# Patient Record
Sex: Female | Born: 1955 | Race: Black or African American | Hispanic: No | Marital: Single | Smoking: Never smoker
Health system: Southern US, Community
[De-identification: ages and names within clinical notes are randomized; demographics above are authoritative.]

## PROBLEM LIST (undated history)

## (undated) DIAGNOSIS — I10 Essential (primary) hypertension: Secondary | ICD-10-CM

## (undated) HISTORY — DX: Essential (primary) hypertension: I10

---

## 2002-10-07 ENCOUNTER — Emergency Department (HOSPITAL_COMMUNITY): Admission: EM | Admit: 2002-10-07 | Discharge: 2002-10-07 | Payer: Self-pay | Admitting: Emergency Medicine

## 2003-09-18 ENCOUNTER — Other Ambulatory Visit: Admission: RE | Admit: 2003-09-18 | Discharge: 2003-09-18 | Payer: Self-pay | Admitting: Family Medicine

## 2003-09-25 ENCOUNTER — Ambulatory Visit (HOSPITAL_COMMUNITY): Admission: RE | Admit: 2003-09-25 | Discharge: 2003-09-25 | Payer: Self-pay | Admitting: Family Medicine

## 2004-05-24 ENCOUNTER — Ambulatory Visit: Payer: Self-pay | Admitting: Nurse Practitioner

## 2012-06-06 HISTORY — PX: OTHER SURGICAL HISTORY: SHX169

## 2017-06-29 ENCOUNTER — Other Ambulatory Visit: Payer: Self-pay | Admitting: Internal Medicine

## 2017-06-29 DIAGNOSIS — Z1231 Encounter for screening mammogram for malignant neoplasm of breast: Secondary | ICD-10-CM

## 2017-06-29 DIAGNOSIS — E2839 Other primary ovarian failure: Secondary | ICD-10-CM

## 2017-07-25 ENCOUNTER — Ambulatory Visit
Admission: RE | Admit: 2017-07-25 | Discharge: 2017-07-25 | Disposition: A | Discharge status: Home / Self Care | Payer: Medicaid Other | Source: Ambulatory Visit | Attending: Internal Medicine | Admitting: Internal Medicine

## 2017-07-25 DIAGNOSIS — E2839 Other primary ovarian failure: Secondary | ICD-10-CM

## 2017-07-25 DIAGNOSIS — Z1231 Encounter for screening mammogram for malignant neoplasm of breast: Secondary | ICD-10-CM

## 2017-07-26 ENCOUNTER — Other Ambulatory Visit: Payer: Self-pay | Admitting: Internal Medicine

## 2017-07-26 DIAGNOSIS — R928 Other abnormal and inconclusive findings on diagnostic imaging of breast: Secondary | ICD-10-CM

## 2017-08-01 ENCOUNTER — Ambulatory Visit
Admission: RE | Admit: 2017-08-01 | Discharge: 2017-08-01 | Disposition: A | Discharge status: Home / Self Care | Payer: Medicaid Other | Source: Ambulatory Visit | Attending: Internal Medicine | Admitting: Internal Medicine

## 2017-08-01 ENCOUNTER — Other Ambulatory Visit: Payer: Self-pay | Admitting: Internal Medicine

## 2017-08-01 DIAGNOSIS — R928 Other abnormal and inconclusive findings on diagnostic imaging of breast: Secondary | ICD-10-CM

## 2018-01-30 ENCOUNTER — Other Ambulatory Visit: Payer: Medicaid Other

## 2018-02-21 ENCOUNTER — Other Ambulatory Visit: Payer: Self-pay | Admitting: Internal Medicine

## 2018-02-21 DIAGNOSIS — R928 Other abnormal and inconclusive findings on diagnostic imaging of breast: Secondary | ICD-10-CM

## 2018-03-02 ENCOUNTER — Other Ambulatory Visit: Payer: Self-pay | Admitting: Internal Medicine

## 2018-03-02 ENCOUNTER — Ambulatory Visit
Admission: RE | Admit: 2018-03-02 | Discharge: 2018-03-02 | Disposition: A | Discharge status: Home / Self Care | Payer: Medicaid Other | Source: Ambulatory Visit | Attending: Internal Medicine | Admitting: Internal Medicine

## 2018-03-02 DIAGNOSIS — N6310 Unspecified lump in the right breast, unspecified quadrant: Secondary | ICD-10-CM

## 2018-03-02 DIAGNOSIS — N6320 Unspecified lump in the left breast, unspecified quadrant: Secondary | ICD-10-CM

## 2018-03-02 DIAGNOSIS — R928 Other abnormal and inconclusive findings on diagnostic imaging of breast: Secondary | ICD-10-CM

## 2018-05-24 ENCOUNTER — Encounter: Payer: Self-pay | Admitting: Obstetrics and Gynecology

## 2018-05-24 ENCOUNTER — Other Ambulatory Visit (HOSPITAL_COMMUNITY)
Admission: RE | Admit: 2018-05-24 | Discharge: 2018-05-24 | Disposition: A | Discharge status: Home / Self Care | Payer: Medicaid Other | Source: Ambulatory Visit | Attending: Obstetrics and Gynecology | Admitting: Obstetrics and Gynecology

## 2018-05-24 ENCOUNTER — Ambulatory Visit: Payer: Medicaid Other | Admitting: Obstetrics and Gynecology

## 2018-05-24 VITALS — BP 130/86 | HR 87 | Ht 63.0 in | Wt 166.1 lb

## 2018-05-24 DIAGNOSIS — Z01419 Encounter for gynecological examination (general) (routine) without abnormal findings: Secondary | ICD-10-CM | POA: Insufficient documentation

## 2018-05-24 DIAGNOSIS — Z0000 Encounter for general adult medical examination without abnormal findings: Secondary | ICD-10-CM | POA: Diagnosis not present

## 2018-05-24 DIAGNOSIS — Z113 Encounter for screening for infections with a predominantly sexual mode of transmission: Secondary | ICD-10-CM

## 2018-05-24 NOTE — Progress Notes (Signed)
New patient in the office for annual, states last mammogram 04-23-18. Pt has no complaints today.

## 2018-05-24 NOTE — Patient Instructions (Signed)
Try Remifemin for menopausal symptoms.

## 2018-05-24 NOTE — Progress Notes (Signed)
GYNECOLOGY ANNUAL PREVENTATIVE CARE ENCOUNTER NOTE  Subjective:   Tracy Walter is a 62 y.o. 674P4 female here for a annual gynecologic exam. Current complaints: none. Has not had vaginal bleeding in 12 years. She report night sweats, hot flashes, mood swings. Denies abnormal vaginal bleeding, discharge, pelvic pain, problems with intercourse or other gynecologic concerns.    Gynecologic History No LMP recorded. Patient is postmenopausal. Contraception: post menopausal status Last Pap: 10 years. Results were: normal Last mammogram: 02/2018. Results were: Birads 3, has f/u scheduled for 08/2018  Obstetric History OB History  Gravida Para Term Preterm AB Living  4         4  SAB TAB Ectopic Multiple Live Births          4    # Outcome Date GA Lbr Len/2nd Weight Sex Delivery Anes PTL Lv  4 Gravida 06/30/90     CS-LTranv   LIV  3 Gravida 03/30/78     Vag-Spont   LIV  2 Gravida 04/15/77     Vag-Spont   LIV  1 Gravida 10/01/70     Vag-Spont   LIV    Past Medical History:  Diagnosis Date  . Hypertension     Past Surgical History:  Procedure Laterality Date  . bunion removal  2014    Current Outpatient Medications on File Prior to Visit  Medication Sig Dispense Refill  . amLODipine-atorvastatin (CADUET) 5-10 MG tablet Take 1 tablet by mouth daily.    Marland Kitchen. atorvastatin (LIPITOR) 20 MG tablet Take 20 mg by mouth daily.    . meloxicam (MOBIC) 7.5 MG tablet Take 7.5 mg by mouth 2 (two) times daily as needed for pain.     No current facility-administered medications on file prior to visit.     No Known Allergies  Social History   Socioeconomic History  . Marital status: Single    Spouse name: Not on file  . Number of children: Not on file  . Years of education: Not on file  . Highest education level: Not on file  Occupational History  . Not on file  Social Needs  . Financial resource strain: Not on file  . Food insecurity:    Worry: Not on file    Inability: Not on  file  . Transportation needs:    Medical: Not on file    Non-medical: Not on file  Tobacco Use  . Smoking status: Never Smoker  . Smokeless tobacco: Never Used  Substance and Sexual Activity  . Alcohol use: Yes    Alcohol/week: 1.0 standard drinks    Types: 1 Cans of beer per week    Frequency: Never    Comment: occasionally  . Drug use: Never  . Sexual activity: Not Currently  Lifestyle  . Physical activity:    Days per week: Not on file    Minutes per session: Not on file  . Stress: Not on file  Relationships  . Social connections:    Talks on phone: Not on file    Gets together: Not on file    Attends religious service: Not on file    Active member of club or organization: Not on file    Attends meetings of clubs or organizations: Not on file    Relationship status: Not on file  . Intimate partner violence:    Fear of current or ex partner: Not on file    Emotionally abused: Not on file    Physically abused: Not  on file    Forced sexual activity: Not on file  Other Topics Concern  . Not on file  Social History Narrative  . Not on file    History reviewed. No pertinent family history.  The following portions of the patient's history were reviewed and updated as appropriate: allergies, current medications, past family history, past medical history, past social history, past surgical history and problem list.  Review of Systems Pertinent items are noted in HPI.   Objective:  BP 130/86   Pulse 87   Ht 5\' 3"  (1.6 m)   Wt 166 lb 1.6 oz (75.3 kg)   BMI 29.42 kg/m  CONSTITUTIONAL: Well-developed, well-nourished female in no acute distress.  HENT:  Normocephalic, atraumatic, External right and left ear normal. Oropharynx is clear and moist EYES: Conjunctivae and EOM are normal. Pupils are equal, round, and reactive to light. No scleral icterus.  NECK: Normal range of motion, supple, no masses.  Normal thyroid.  SKIN: Skin is warm and dry. No rash noted. Not  diaphoretic. No erythema. No pallor. NEUROLOGIC: Alert and oriented to person, place, and time. Normal reflexes, muscle tone coordination. No cranial nerve deficit noted. PSYCHIATRIC: Normal mood and affect. Normal behavior. Normal judgment and thought content. CARDIOVASCULAR: Normal heart rate noted, regular rhythm RESPIRATORY: Clear to auscultation bilaterally. Effort and breath sounds normal, no problems with respiration noted. BREASTS: Symmetric in size. No masses, skin changes, nipple drainage, or lymphadenopathy. ABDOMEN: Soft, normal bowel sounds, no distention noted.  No tenderness, rebound or guarding.  PELVIC: Normal appearing external genitalia; normal appearing vaginal mucosa and cervix.  No abnormal discharge noted.  Pap smear obtained.  Normal uterine size, no other palpable masses, no uterine or adnexal tenderness. MUSCULOSKELETAL: Normal range of motion. No tenderness.  No cyanosis, clubbing, or edema.  2+ distal pulses.   Assessment and Plan:   1. Well woman exam Benign exam - Cytology - PAP( [place]) - mammogram UTD  2. Routine screening for STI (sexually transmitted infection) - HIV Antibody (routine testing w rflx) - RPR - Hepatitis B surface antigen - Hepatitis C Antibody   Will follow up results of pap smear/STI screen and manage accordingly. Encouraged improvement in diet and exercise.  Mammogram scheduled for 08/2018 Referral for colonoscopy previous made, has appointment  Flu vaccine given already  Routine preventative health maintenance measures emphasized. Please refer to After Visit Summary for other counseling recommendations.    Baldemar LenisK. Meryl Davis, M.D. Attending [facility](Faculty Practice)

## 2018-05-25 LAB — HEPATITIS C ANTIBODY: Hep C Virus Ab: <0.1 s/co ratio (ref 0.0–0.9)

## 2018-05-25 LAB — HIV ANTIBODY (ROUTINE TESTING W REFLEX): HIV SCREEN 4TH GENERATION: NONREACTIVE

## 2018-05-25 LAB — HEPATITIS B SURFACE ANTIGEN: Hepatitis B Surface Ag: NEGATIVE

## 2018-05-25 LAB — RPR: RPR: NONREACTIVE

## 2018-05-28 LAB — CYTOLOGY - PAP
Chlamydia: NEGATIVE
Diagnosis: NEGATIVE
HPV (WINDOPATH): NOT DETECTED
NEISSERIA GONORRHEA: NEGATIVE

## 2018-09-03 ENCOUNTER — Other Ambulatory Visit: Payer: Medicaid Other

## 2018-09-03 ENCOUNTER — Inpatient Hospital Stay: Admission: RE | Admit: 2018-09-03 | Payer: Medicaid Other | Source: Ambulatory Visit

## 2019-10-12 IMAGING — MG DIGITAL DIAGNOSTIC UNILATERAL LEFT MAMMOGRAM WITH TOMO AND CAD
6 series · 6 of 18 positions shown · non-contrast
Comparison: Previous exam(s).

CLINICAL DATA: Patient presents for additional views of the left
breast as follow-up to a recent screening exam to evaluate a
possible mass.

EXAM:
DIGITAL DIAGNOSTIC left MAMMOGRAM WITH TOMO
ULTRASOUND left BREAST

[L CC synth-2D (1 of 2)]
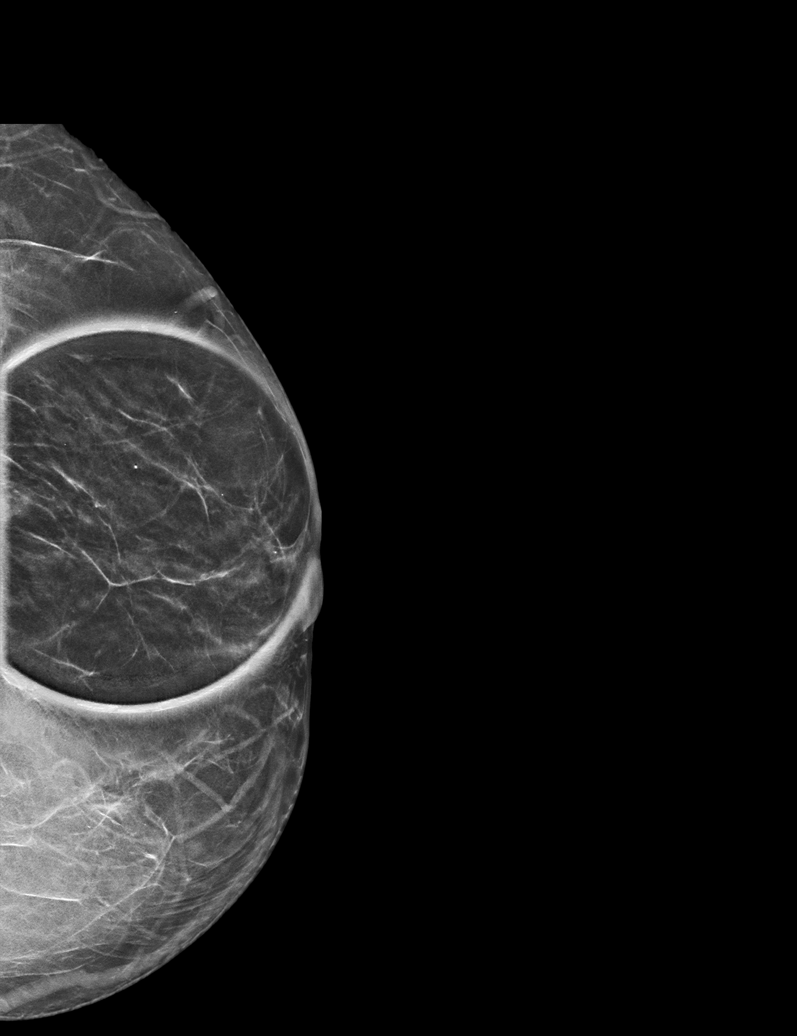

[L MLO synth-2D]
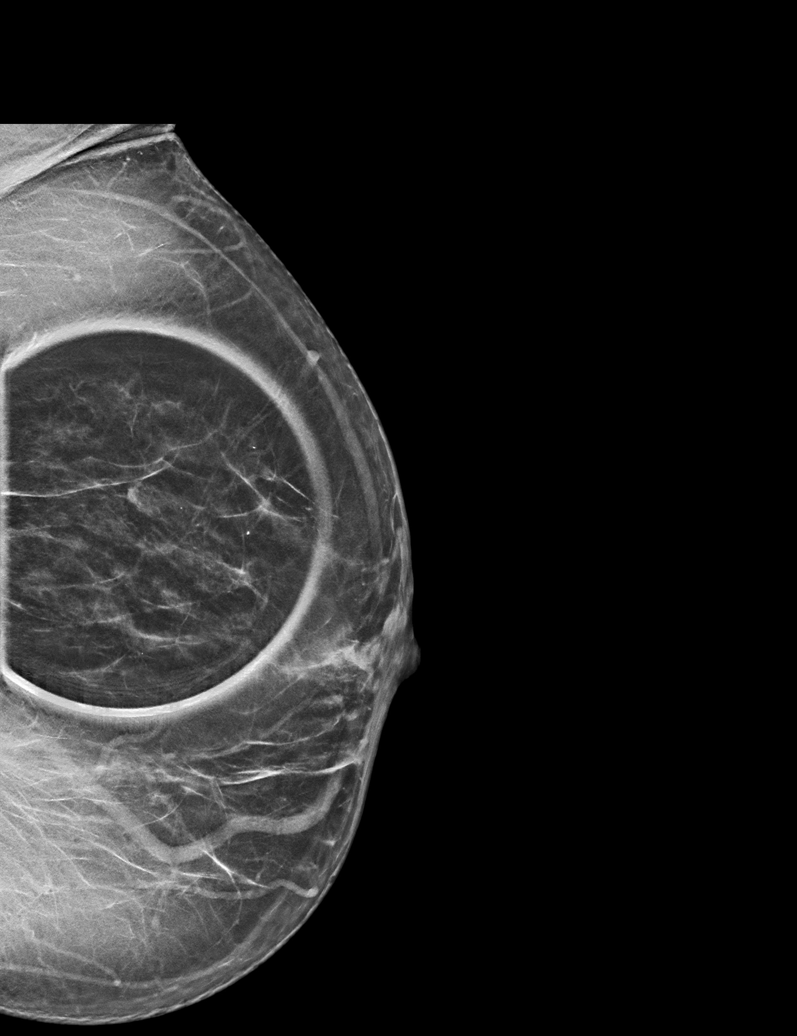

[L CC synth-2D (2 of 2)]
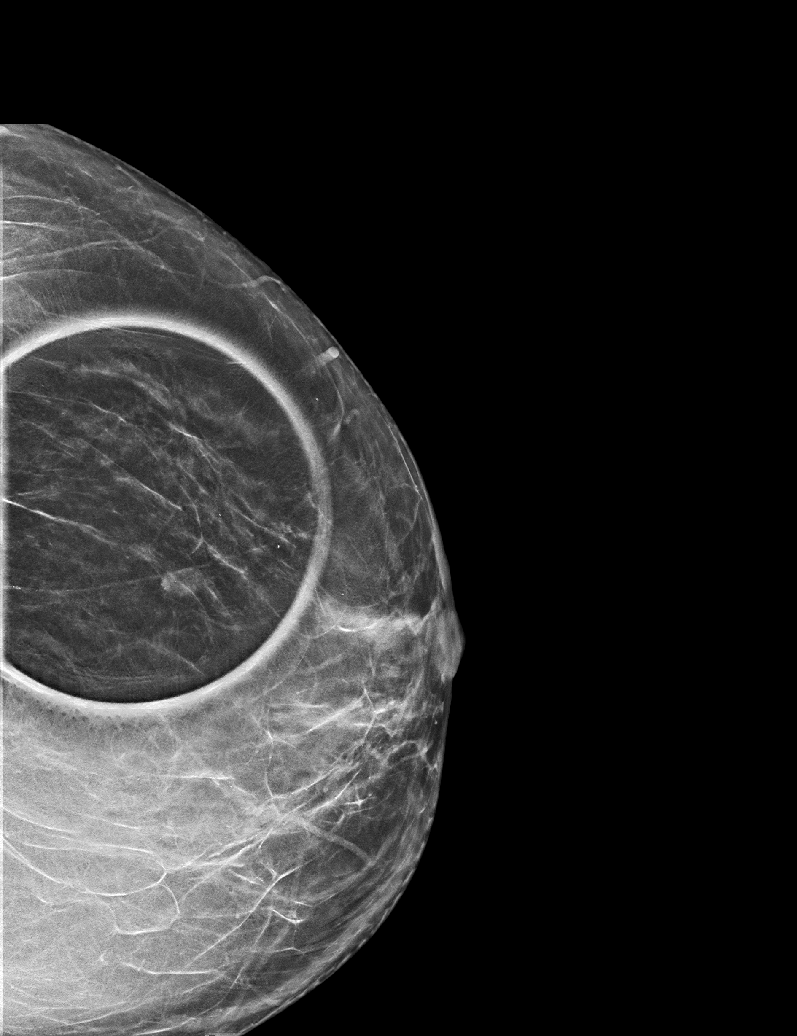

[L MLO tomo · tomo slice 25/50.0]
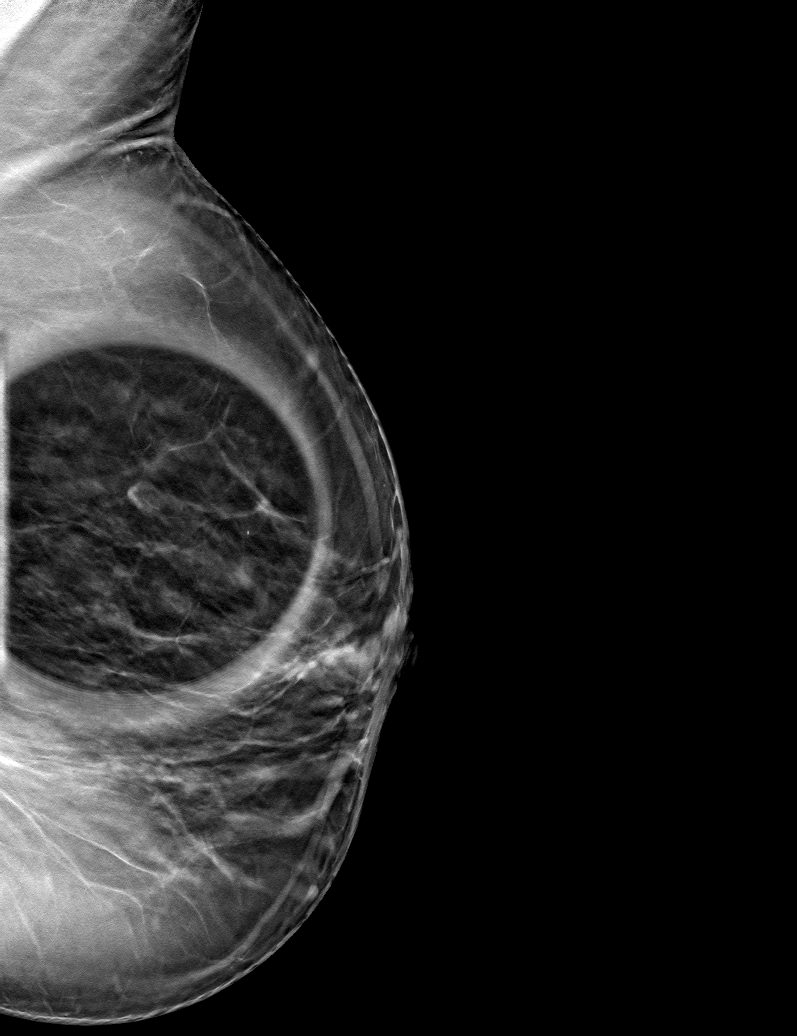

[L CC tomo (1 of 2) · tomo slice 23/45.0]
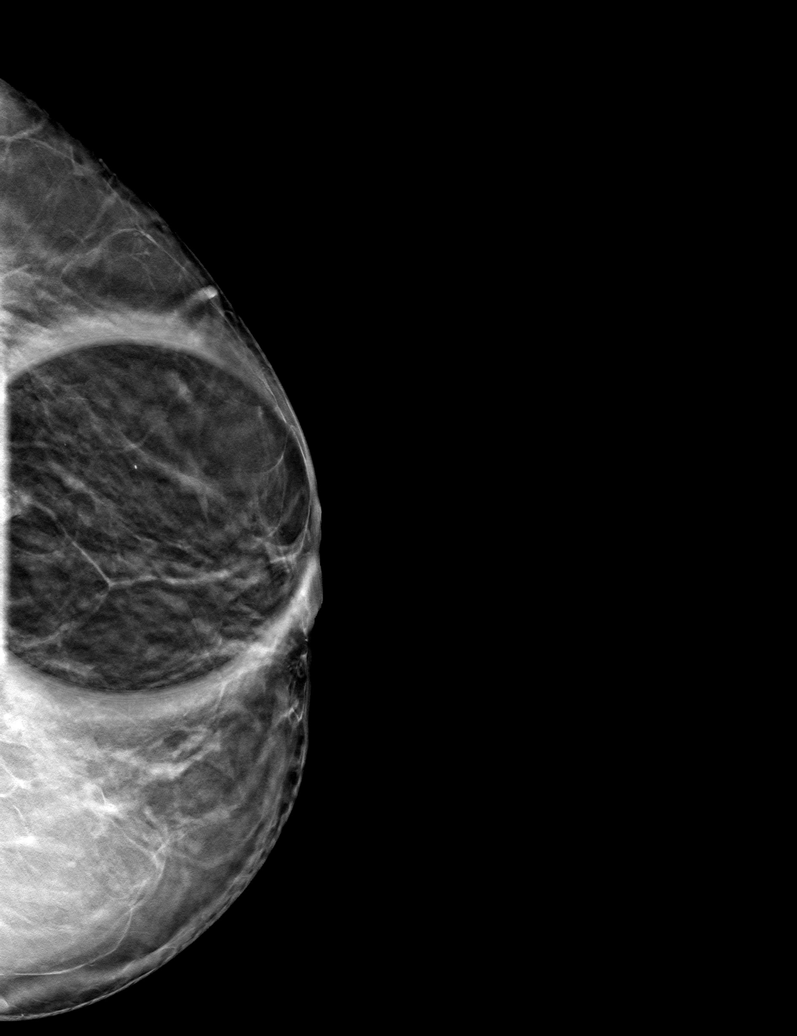

[L CC tomo (2 of 2) · tomo slice 25/50.0]
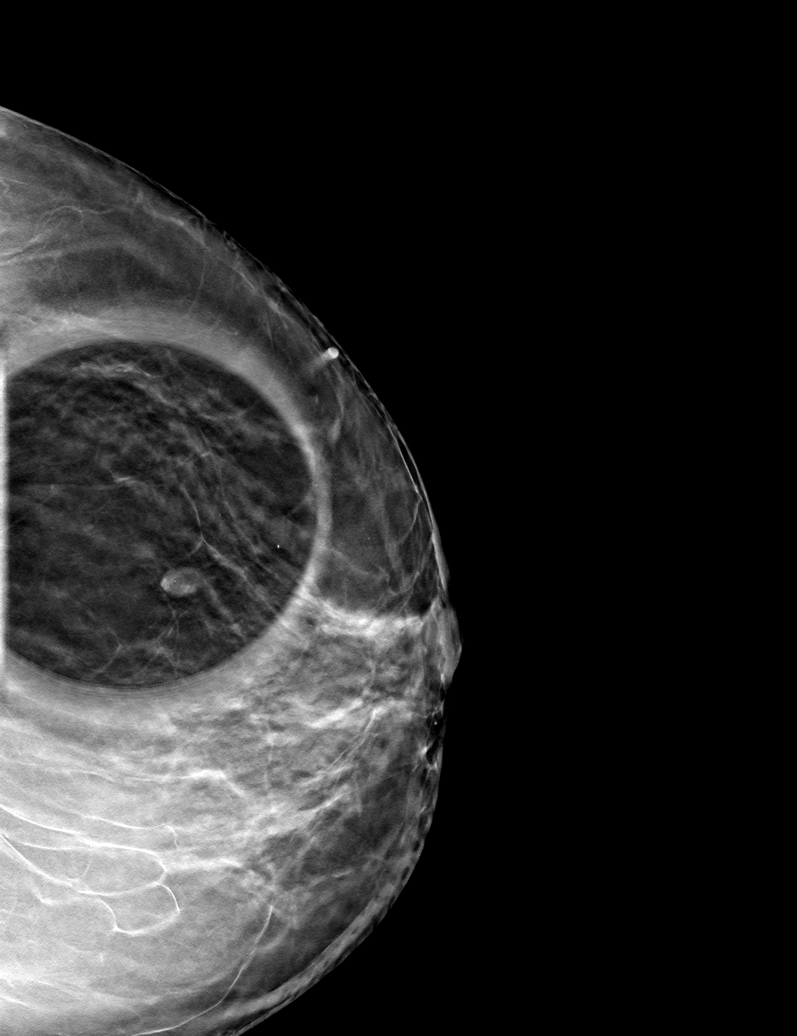

[6 of 18 positions shown; findings below may reference images not displayed]

ACR Breast Density Category b: There are scattered areas of
fibroglandular density.
FINDINGS: Spot compression tomographic images demonstrate an oval
circumscribed 8 mm mass which appears to have a fatty component and
suggestion of a few smudgy calcifications.

Targeted ultrasound is performed, showing in oval circumscribed
hypoechoic mass with parallel long axis at the [DATE] position of the
left breast 2 cm from the nipple corresponding to the mammographic
abnormality. This measures 4 x 8 x 10 mm with eccentric echogenic
component. This likely represents a benign process and may represent
a intramammary lymph node or apocrine metaplasia.

Ultrasound of the left axilla is within normal.
IMPRESSION: Probable benign mass over the [DATE] position of the left breast 2 cm
from the nipple measuring 4 x 8 x 10 mm.

RECOMMENDATION:
Recommend six-month follow-up diagnostic left breast ultrasound to
document stability.

I have discussed the findings and recommendations with the patient.
Results were also provided in writing at the conclusion of the
visit. If applicable, a reminder letter will be sent to the patient
regarding the next appointment.

BI-RADS CATEGORY  3: Probably benign.

## 2020-05-12 IMAGING — US US BREAST*L* LIMITED INC AXILLA
1 series · 7 of 7 positions shown · non-contrast
Comparison: Previous exam(s).

CLINICAL DATA: 61-year-old female for follow-up of LEFT breast
mass.

EXAM:
ULTRASOUND OF THE LEFT BREAST

[Series 1: us breast*left* limited inc axilla · 0.04mm/px · 7 of 7 slices shown]
[im 1/7]
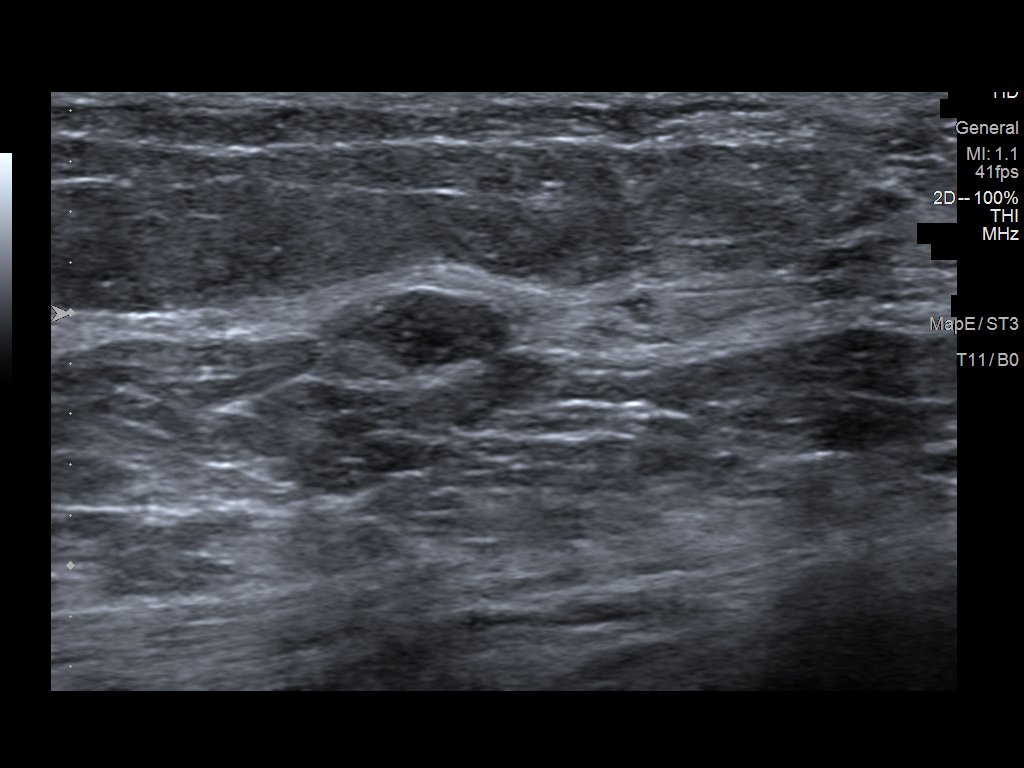
[im 2/7]
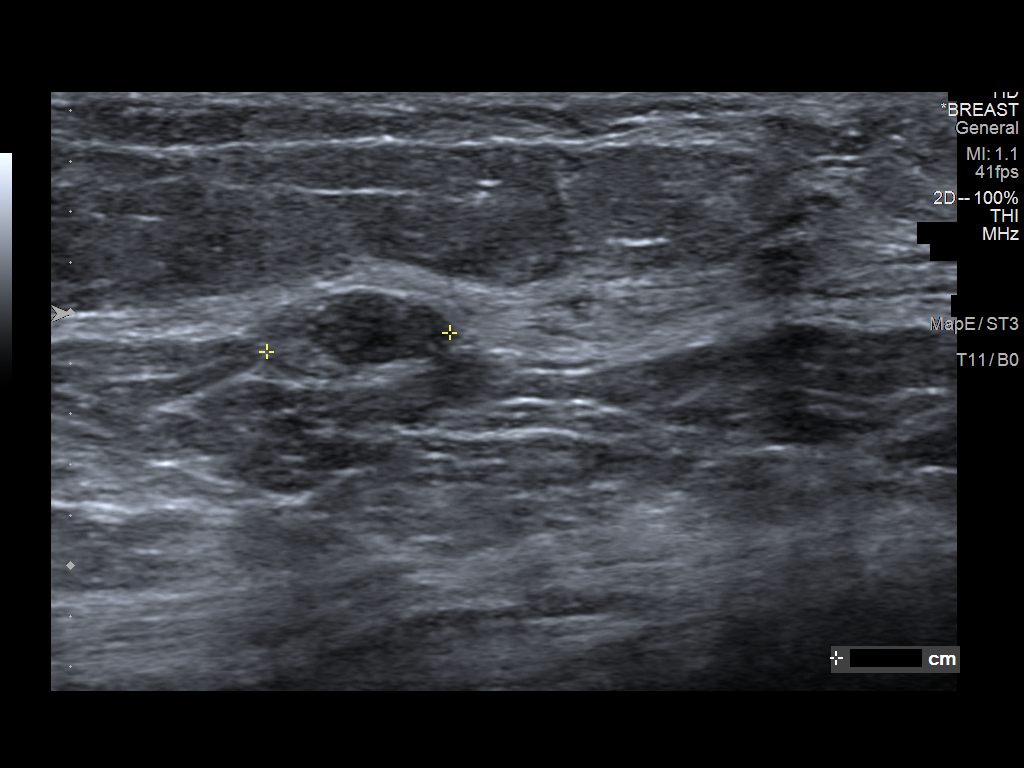
[im 3/7]
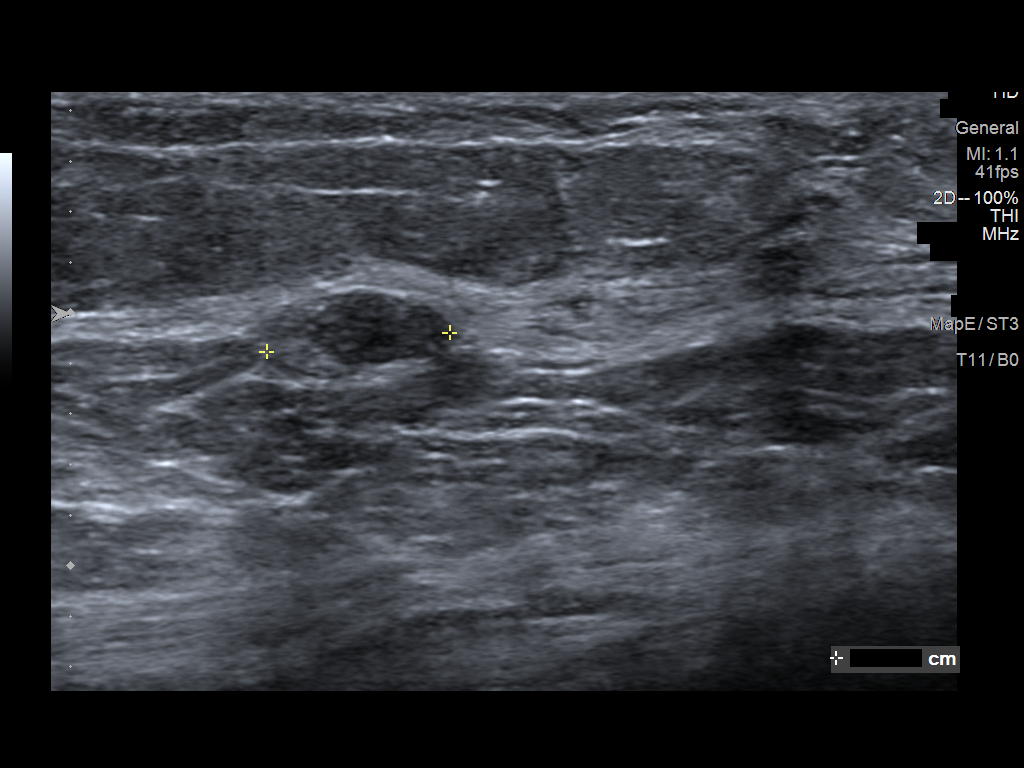
[im 4/7]
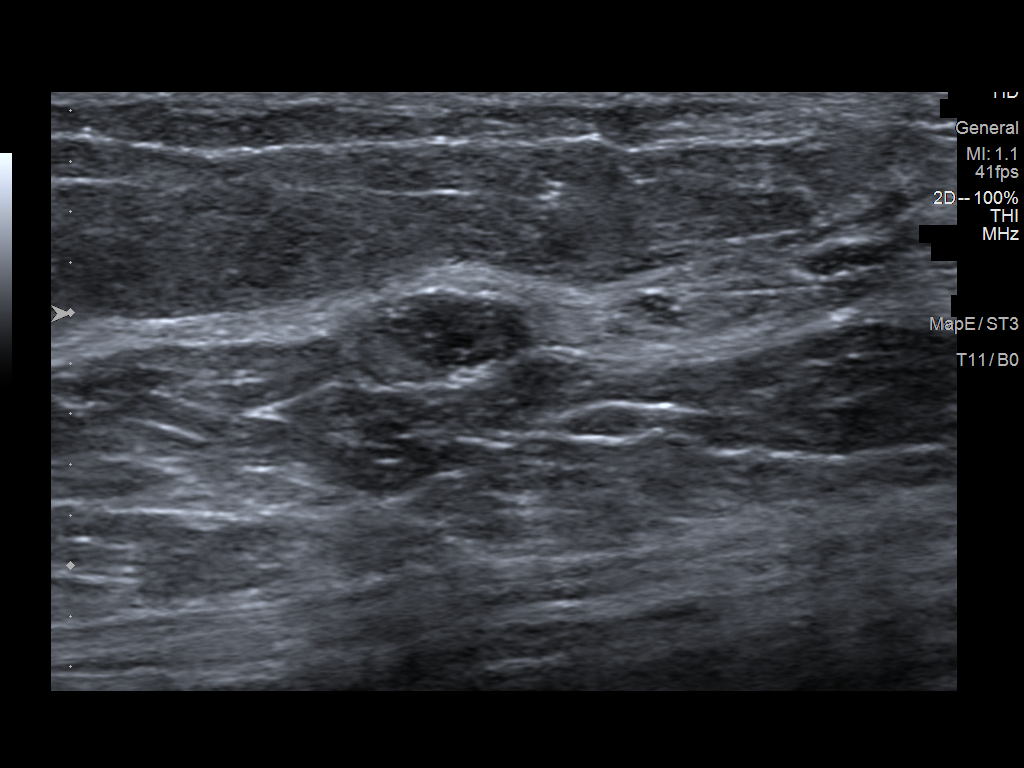
[im 5/7]
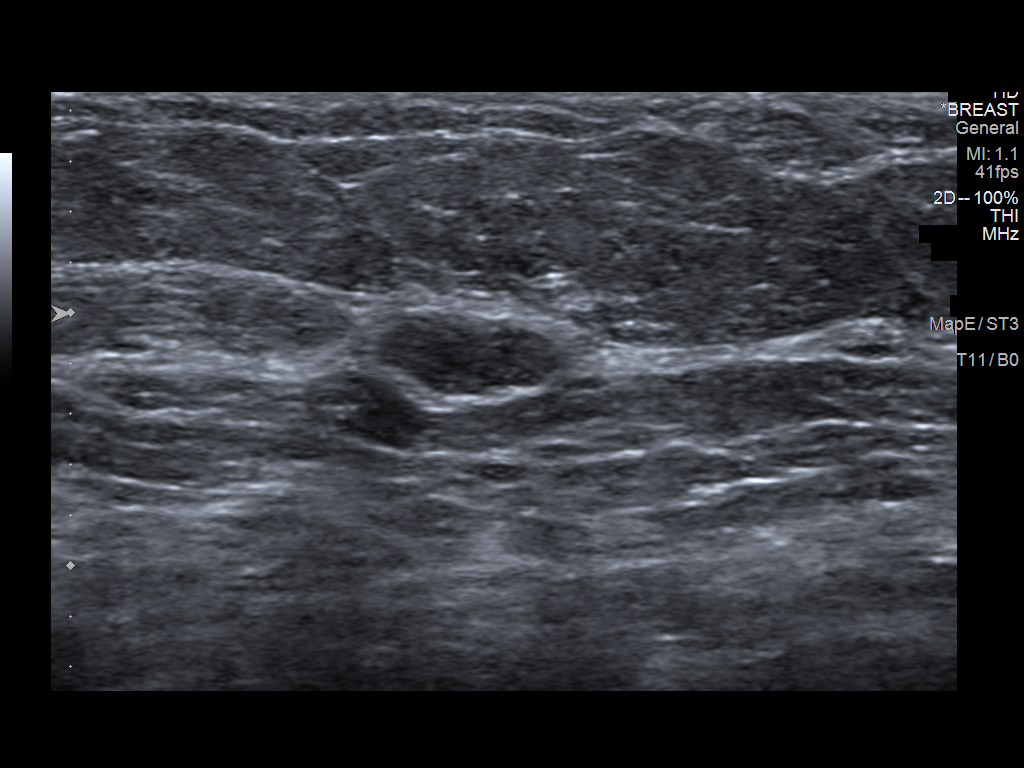
[im 6/7]
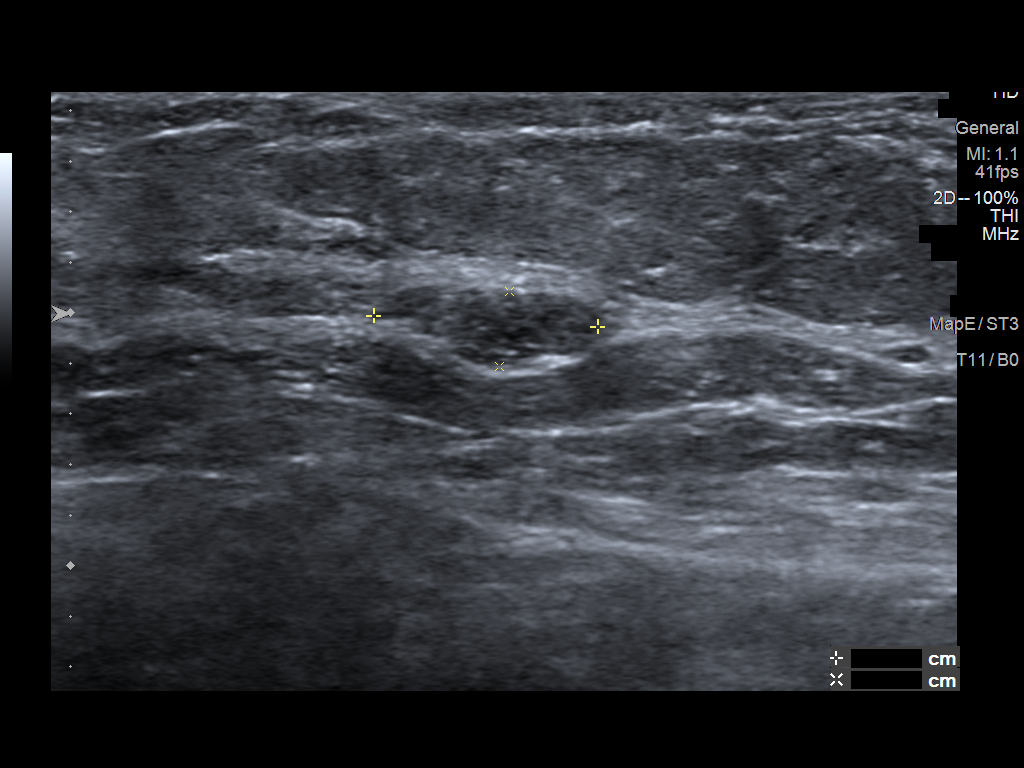
[im 7/7]
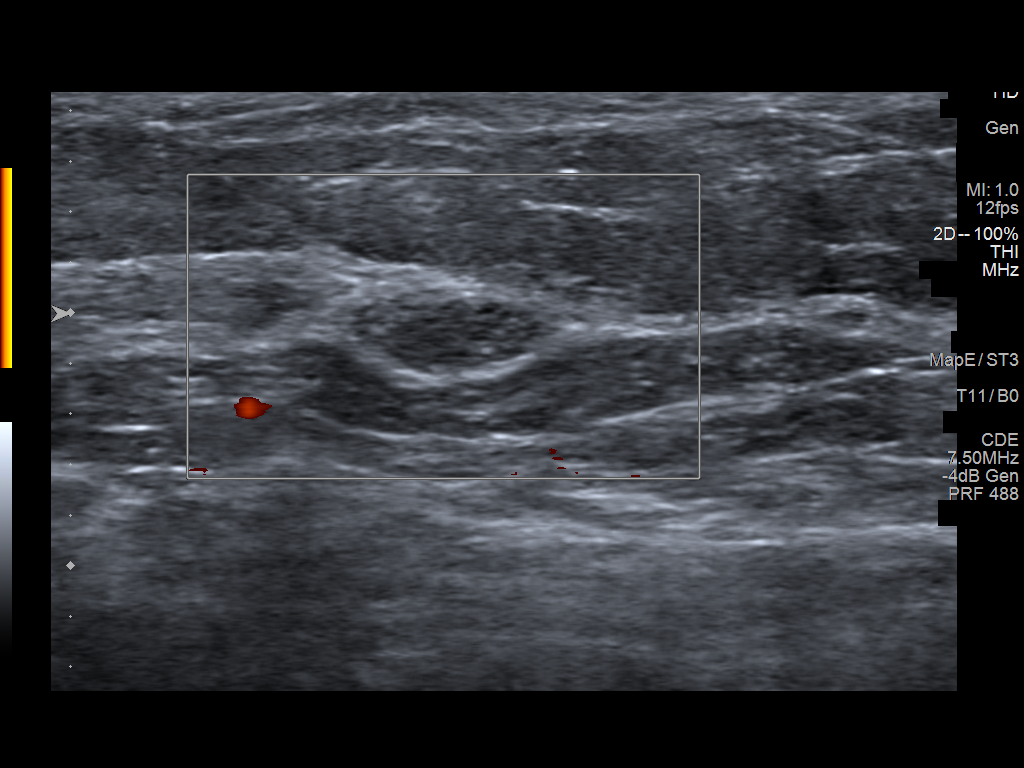

[7 of 7 positions shown; findings below may reference images not displayed]

FINDINGS: Targeted ultrasound is performed, showing a stable 0.9 x 0.3 x
cm circumscribed oval hypoechoic parallel mass at the [DATE] position
of the LEFT breast 2 cm from the nipple, likely representing a
fibroadenoma.
IMPRESSION: Stable likely benign mass within the UPPER LEFT breast. Six-month
follow-up recommended to ensure 1 year stability.

RECOMMENDATION:
Bilateral diagnostic mammogram and LEFT breast ultrasound in 6
months to resume annual mammogram schedule and reassess likely
benign LEFT breast finding.

I have discussed the findings and recommendations with the patient.
Results were also provided in writing at the conclusion of the
visit. If applicable, a reminder letter will be sent to the patient
regarding the next appointment.

BI-RADS CATEGORY  3: Probably benign.

## 2020-08-04 ENCOUNTER — Other Ambulatory Visit: Payer: Self-pay | Admitting: Internal Medicine

## 2020-08-04 DIAGNOSIS — E2839 Other primary ovarian failure: Secondary | ICD-10-CM

## 2022-05-20 ENCOUNTER — Other Ambulatory Visit: Payer: Self-pay | Admitting: Internal Medicine

## 2022-05-21 LAB — CBC
HCT: 37.5 % (ref 35.0–45.0)
Hemoglobin: 13.2 g/dL (ref 11.7–15.5)
MCH: 34.6 pg — ABNORMAL HIGH (ref 27.0–33.0)
MCHC: 35.2 g/dL (ref 32.0–36.0)
MCV: 98.2 fL (ref 80.0–100.0)
MPV: 10.4 fL (ref 7.5–12.5)
Platelets: 224 10*3/uL (ref 140–400)
RBC: 3.82 10*6/uL (ref 3.80–5.10)
RDW: 12.9 % (ref 11.0–15.0)
WBC: 25.2 10*3/uL — ABNORMAL HIGH (ref 3.8–10.8)

## 2022-05-21 LAB — COMPLETE METABOLIC PANEL WITH GFR
AG Ratio: 1.3 (calc) (ref 1.0–2.5)
ALT: 25 U/L (ref 6–29)
AST: 37 U/L — ABNORMAL HIGH (ref 10–35)
Albumin: 4.6 g/dL (ref 3.6–5.1)
Alkaline phosphatase (APISO): 77 U/L (ref 37–153)
BUN: 7 mg/dL (ref 7–25)
CO2: 18 mmol/L — ABNORMAL LOW (ref 20–32)
Calcium: 10 mg/dL (ref 8.6–10.4)
Chloride: 96 mmol/L — ABNORMAL LOW (ref 98–110)
Creat: 0.81 mg/dL (ref 0.50–1.05)
Globulin: 3.5 g/dL (calc) (ref 1.9–3.7)
Glucose, Bld: 112 mg/dL — ABNORMAL HIGH (ref 65–99)
Potassium: 3.5 mmol/L (ref 3.5–5.3)
Sodium: 131 mmol/L — ABNORMAL LOW (ref 135–146)
Total Bilirubin: 0.9 mg/dL (ref 0.2–1.2)
Total Protein: 8.1 g/dL (ref 6.1–8.1)
eGFR: 80 mL/min/{1.73_m2} (ref >=60)

## 2022-05-21 LAB — LIPID PANEL
Cholesterol: 157 mg/dL (ref <200)
HDL: 69 mg/dL (ref >=50)
LDL Cholesterol (Calc): 61 mg/dL (calc)
Non-HDL Cholesterol (Calc): 88 mg/dL (calc) (ref <130)
Total CHOL/HDL Ratio: 2.3 (calc) (ref <5.0)
Triglycerides: 209 mg/dL — ABNORMAL HIGH (ref <150)

## 2022-05-21 LAB — TSH: TSH: 0.49 mIU/L (ref 0.40–4.50)

## 2022-05-21 LAB — VITAMIN D 25 HYDROXY (VIT D DEFICIENCY, FRACTURES): Vit D, 25-Hydroxy: 37 ng/mL (ref 30–100)

## 2022-07-26 ENCOUNTER — Other Ambulatory Visit: Payer: Self-pay | Admitting: Internal Medicine

## 2022-07-26 DIAGNOSIS — Z1231 Encounter for screening mammogram for malignant neoplasm of breast: Secondary | ICD-10-CM

## 2022-09-06 ENCOUNTER — Ambulatory Visit
Admission: RE | Admit: 2022-09-06 | Discharge: 2022-09-06 | Disposition: A | Discharge status: Home / Self Care | Payer: Medicare Other | Source: Ambulatory Visit | Attending: Internal Medicine | Admitting: Internal Medicine

## 2022-09-06 DIAGNOSIS — Z1231 Encounter for screening mammogram for malignant neoplasm of breast: Secondary | ICD-10-CM

## 2022-09-09 ENCOUNTER — Other Ambulatory Visit: Payer: Self-pay | Admitting: Internal Medicine

## 2022-09-09 LAB — CBC WITH DIFFERENTIAL/PLATELET
Absolute Monocytes: 1029 cells/uL — ABNORMAL HIGH (ref 200–950)
Basophils Absolute: 34 cells/uL (ref 0–200)
Basophils Relative: 0.4 %
Eosinophils Absolute: 119 cells/uL (ref 15–500)
Eosinophils Relative: 1.4 %
HCT: 38.4 % (ref 35.0–45.0)
Hemoglobin: 13.3 g/dL (ref 11.7–15.5)
Lymphs Abs: 2023 cells/uL (ref 850–3900)
MCH: 33.6 pg — ABNORMAL HIGH (ref 27.0–33.0)
MCHC: 34.6 g/dL (ref 32.0–36.0)
MCV: 97 fL (ref 80.0–100.0)
MPV: 10.7 fL (ref 7.5–12.5)
Monocytes Relative: 12.1 %
Neutro Abs: 5296 cells/uL (ref 1500–7800)
Neutrophils Relative %: 62.3 %
Platelets: 268 10*3/uL (ref 140–400)
RBC: 3.96 10*6/uL (ref 3.80–5.10)
RDW: 11.9 % (ref 11.0–15.0)
Total Lymphocyte: 23.8 %
WBC: 8.5 10*3/uL (ref 3.8–10.8)

## 2022-09-09 LAB — EXTRA SPECIMEN

## 2023-08-28 ENCOUNTER — Other Ambulatory Visit: Payer: Self-pay | Admitting: Internal Medicine

## 2023-08-28 DIAGNOSIS — E2839 Other primary ovarian failure: Secondary | ICD-10-CM

## 2024-04-30 ENCOUNTER — Other Ambulatory Visit

## 2024-05-14 ENCOUNTER — Inpatient Hospital Stay (HOSPITAL_BASED_OUTPATIENT_CLINIC_OR_DEPARTMENT_OTHER): Admission: RE | Admit: 2024-05-14 | Source: Ambulatory Visit
